# Patient Record
Sex: Male | Born: 1960 | Race: Black or African American | Hispanic: No | Smoking: Current every day smoker
Health system: Southern US, Community
[De-identification: ages and names within clinical notes are randomized; demographics above are authoritative.]

## PROBLEM LIST (undated history)

## (undated) DIAGNOSIS — B20 Human immunodeficiency virus [HIV] disease: Secondary | ICD-10-CM

## (undated) DIAGNOSIS — E785 Hyperlipidemia, unspecified: Secondary | ICD-10-CM

## (undated) DIAGNOSIS — J45909 Unspecified asthma, uncomplicated: Secondary | ICD-10-CM

## (undated) DIAGNOSIS — I509 Heart failure, unspecified: Secondary | ICD-10-CM

## (undated) DIAGNOSIS — Z21 Asymptomatic human immunodeficiency virus [HIV] infection status: Secondary | ICD-10-CM

---

## 2016-02-03 ENCOUNTER — Encounter: Payer: Self-pay | Admitting: Emergency Medicine

## 2016-02-03 ENCOUNTER — Emergency Department: Payer: Self-pay

## 2016-02-03 ENCOUNTER — Emergency Department
Admission: EM | Admit: 2016-02-03 | Discharge: 2016-02-04 | Disposition: A | Discharge status: Home / Self Care | Payer: Self-pay | Attending: Emergency Medicine | Admitting: Emergency Medicine

## 2016-02-03 DIAGNOSIS — K458 Other specified abdominal hernia without obstruction or gangrene: Secondary | ICD-10-CM | POA: Insufficient documentation

## 2016-02-03 DIAGNOSIS — F17200 Nicotine dependence, unspecified, uncomplicated: Secondary | ICD-10-CM | POA: Insufficient documentation

## 2016-02-03 DIAGNOSIS — R05 Cough: Secondary | ICD-10-CM | POA: Insufficient documentation

## 2016-02-03 DIAGNOSIS — J45901 Unspecified asthma with (acute) exacerbation: Secondary | ICD-10-CM | POA: Insufficient documentation

## 2016-02-03 DIAGNOSIS — Z760 Encounter for issue of repeat prescription: Secondary | ICD-10-CM | POA: Insufficient documentation

## 2016-02-03 DIAGNOSIS — K625 Hemorrhage of anus and rectum: Secondary | ICD-10-CM | POA: Insufficient documentation

## 2016-02-03 HISTORY — DX: Heart failure, unspecified: I50.9

## 2016-02-03 HISTORY — DX: Unspecified asthma, uncomplicated: J45.909

## 2016-02-03 HISTORY — DX: Asymptomatic human immunodeficiency virus (hiv) infection status: Z21

## 2016-02-03 HISTORY — DX: Hyperlipidemia, unspecified: E78.5

## 2016-02-03 HISTORY — DX: Human immunodeficiency virus (HIV) disease: B20

## 2016-02-03 LAB — URINALYSIS COMPLETE WITH MICROSCOPIC (ARMC ONLY)
BACTERIA UA: NONE SEEN
Bilirubin Urine: NEGATIVE
Glucose, UA: NEGATIVE mg/dL
HGB URINE DIPSTICK: NEGATIVE
LEUKOCYTES UA: NEGATIVE
NITRITE: NEGATIVE
PH: 5 (ref 5.0–8.0)
PROTEIN: NEGATIVE mg/dL
SPECIFIC GRAVITY, URINE: 1.025 (ref 1.005–1.030)
SQUAMOUS EPITHELIAL / LPF: NONE SEEN

## 2016-02-03 LAB — COMPREHENSIVE METABOLIC PANEL
ALBUMIN: 4.2 g/dL (ref 3.5–5.0)
ALT: 27 U/L (ref 17–63)
ANION GAP: 9 (ref 5–15)
AST: 36 U/L (ref 15–41)
Alkaline Phosphatase: 93 U/L (ref 38–126)
BILIRUBIN TOTAL: 0.5 mg/dL (ref 0.3–1.2)
BUN: 15 mg/dL (ref 6–20)
CHLORIDE: 109 mmol/L (ref 101–111)
CO2: 22 mmol/L (ref 22–32)
Calcium: 8.7 mg/dL — ABNORMAL LOW (ref 8.9–10.3)
Creatinine, Ser: 1.19 mg/dL (ref 0.61–1.24)
GFR calc Af Amer: >60 mL/min (ref >60)
Glucose, Bld: 168 mg/dL — ABNORMAL HIGH (ref 65–99)
POTASSIUM: 3.9 mmol/L (ref 3.5–5.1)
Sodium: 140 mmol/L (ref 135–145)
TOTAL PROTEIN: 7.8 g/dL (ref 6.5–8.1)

## 2016-02-03 LAB — CBC
HEMATOCRIT: 40.6 % (ref 40.0–52.0)
HEMOGLOBIN: 14.3 g/dL (ref 13.0–18.0)
MCH: 32.1 pg (ref 26.0–34.0)
MCHC: 35.3 g/dL (ref 32.0–36.0)
MCV: 91 fL (ref 80.0–100.0)
Platelets: 183 10*3/uL (ref 150–440)
RBC: 4.46 MIL/uL (ref 4.40–5.90)
RDW: 13.3 % (ref 11.5–14.5)
WBC: 4.1 10*3/uL (ref 3.8–10.6)

## 2016-02-03 LAB — LIPASE, BLOOD: LIPASE: 35 U/L (ref 11–51)

## 2016-02-03 MED ORDER — LORAZEPAM 2 MG/ML IJ SOLN
1.0000 mg | Freq: Once | INTRAMUSCULAR | Status: AC
  Administered 2016-02-03: 1 mg via INTRAVENOUS
  Filled 2016-02-03: qty 1

## 2016-02-03 MED ORDER — IPRATROPIUM-ALBUTEROL 0.5-2.5 (3) MG/3ML IN SOLN
3.0000 mL | Freq: Once | RESPIRATORY_TRACT | Status: AC
  Administered 2016-02-03: 3 mL via RESPIRATORY_TRACT
  Filled 2016-02-03: qty 3

## 2016-02-03 MED ORDER — IOHEXOL 300 MG/ML  SOLN
100.0000 mL | Freq: Once | INTRAMUSCULAR | Status: AC | PRN
  Administered 2016-02-03: 100 mL via INTRAVENOUS

## 2016-02-03 MED ORDER — IOHEXOL 240 MG/ML SOLN
25.0000 mL | Freq: Once | INTRAMUSCULAR | Status: AC | PRN
  Administered 2016-02-03: 25 mL via ORAL

## 2016-02-03 NOTE — ED Notes (Signed)
Pt in via triage w/ complaints of swelling, sob, abdominal pain and rectal bleeding for about 3 weeks.  Pt reports being HIV positive and being off meds for 3 weeks because he was unable to see primary MD due to no copay.  Pt appears to be short of breath, 97% on room air.  Pt with abdominal distension, pt reports "surgical hernia acting up."  MD at bedside.

## 2016-02-03 NOTE — ED Notes (Signed)
Patient transported to X-ray 

## 2016-02-03 NOTE — ED Notes (Signed)
Pt to ed with c/o abd pain, swelling, sob, and rectal bleeding.  Pt reports he is HIV positive and has been off his meds for 3 weeks. Pt tachypenic at triage rr 28.  Pt in mild resp distress at this time.

## 2016-02-04 MED ORDER — PRAVASTATIN SODIUM 40 MG PO TABS: 40.0000 mg | ORAL_TABLET | Freq: Every day | ORAL | Status: AC

## 2016-02-04 MED ORDER — ELVITEG-COBIC-EMTRICIT-TENOFAF 150-150-200-10 MG PO TABS: 1.0000 | ORAL_TABLET | Freq: Every day | ORAL | Status: AC

## 2016-02-04 MED ORDER — SULFAMETHOXAZOLE-TRIMETHOPRIM 800-160 MG PO TABS: 1.0000 | ORAL_TABLET | Freq: Once | ORAL | Status: AC

## 2016-02-04 NOTE — ED Provider Notes (Signed)
[facility] Emergency Department Provider Note  ____________________________________________  Time seen: 11:00 PM  I have reviewed the triage vital signs and the nursing notes.   HISTORY  Chief Complaint Rectal Bleeding and Shortness of Breath     HPI Gary Gates is a 55 y.o. male with history of HIV hyperlipidemia CHF presents with shortness of breath abdominal discomfort and swelling. Patient states that he has been out of his antiretrovirals 3 weeks secondary to relocating to this area. Patient states current abdominal discomfort he believes to be secondary to his "hernia acting up which was surgically repaired in the past. Patient states current pain score 5 out of 10. Patient denies any vomiting or diarrhea. Patient does however admit to bright red blood per rectum with wiping 3 weeks as well.     Past Medical History  Diagnosis Date  . HIV (human immunodeficiency virus infection) (HCC)   . Hyperlipidemia   . CHF (congestive heart failure) (HCC)   . Asthma     There are no active problems to display for this patient.   History reviewed. No pertinent past surgical history.  Current Outpatient Rx  Name  Route  Sig  Dispense  Refill  . elvitegravir-cobicistat-emtricitabine-tenofovir (GENVOYA) 150-150-200-10 MG TABS tablet   Oral   Take 1 tablet by mouth daily with breakfast.   30 tablet   0   . pravastatin (PRAVACHOL) 40 MG tablet   Oral   Take 1 tablet (40 mg total) by mouth daily.   30 tablet   0   . sulfamethoxazole-trimethoprim (BACTRIM DS,SEPTRA DS) 800-160 MG tablet   Oral   Take 1 tablet by mouth once.   30 tablet   0     Allergies No known drug allergies History reviewed. No pertinent family history.  Social History Social History  Substance Use Topics  . Smoking status: Current Every Day Smoker  . Smokeless tobacco: None  . Alcohol Use: Yes    Review of Systems  Constitutional: Negative for  fever. Eyes: Negative for visual changes. ENT: Negative for sore throat. Cardiovascular: Negative for chest pain. Respiratory: Negative for shortness of breath. Positive for cough and shortness of breath Gastrointestinal: Positive for abdominal pain Genitourinary: Negative for dysuria. Musculoskeletal: Negative for back pain. Skin: Negative for rash. Neurological: Negative for headaches, focal weakness or numbness.   10-point ROS otherwise negative.  ____________________________________________   PHYSICAL EXAM:  VITAL SIGNS: ED Triage Vitals  Enc Vitals Group     BP 02/03/16 1809 106/73 mmHg     Pulse Rate 02/03/16 1809 86     Resp 02/03/16 1809 28     Temp 02/03/16 1809 98.3 F (36.8 C)     Temp Source 02/03/16 1809 Oral     SpO2 02/03/16 1809 98 %     Weight 02/03/16 1809 205 lb (92.987 kg)     Height 02/03/16 1809  (1.778 m)     Head Cir --      Peak Flow --      Pain Score 02/03/16 1809 8     Pain Loc --      Pain Edu? --      Excl. in GC? --      Constitutional: Alert and oriented. Well appearing and in no distress. Eyes: Conjunctivae are normal. PERRL. Normal extraocular movements. ENT   Head: Normocephalic and atraumatic.   Nose: No congestion/rhinnorhea.   Mouth/Throat: Mucous membranes are moist.   Neck: No stridor. Hematological/Lymphatic/Immunilogical: No cervical  lymphadenopathy. Cardiovascular: Normal rate, regular rhythm. Normal and symmetric distal pulses are present in all extremities. No murmurs, rubs, or gallops. Respiratory: Normal respiratory effort without tachypnea nor retractions. Breath sounds are clear and equal bilaterally. No wheezes/rales/rhonchi. Gastrointestinal: Soft and nontender. No distention. There is no CVA tenderness. Genitourinary: deferred Musculoskeletal: Nontender with normal range of motion in all extremities. No joint effusions.  No lower extremity tenderness nor edema. Neurologic:  Normal speech and  language. No gross focal neurologic deficits are appreciated. Speech is normal.  Skin:  Skin is warm, dry and intact. No rash noted. Psychiatric: Mood and affect are normal. Speech and behavior are normal. Patient exhibits appropriate insight and judgment.  ____________________________________________    LABS (pertinent positives/negatives)  Labs Reviewed  COMPREHENSIVE METABOLIC PANEL - Abnormal; Notable for the following:    Glucose, Bld 168 (*)    Calcium 8.7 (*)    All other components within normal limits  URINALYSIS COMPLETEWITH MICROSCOPIC ([facility] ONLY) - Abnormal; Notable for the following:    Color, Urine YELLOW (*)    APPearance CLEAR (*)    Ketones, ur TRACE (*)    All other components within normal limits  LIPASE, BLOOD  CBC     ____________________________________________   EKG  ED ECG REPORT I, Shuntavia Yerby, Raton N, the attending physician, personally viewed and interpreted this ECG.   Date: 02/04/2016  EKG Time: 6:32 PM  Rate: 84  Rhythm: Normal sinus rhythm  Axis: Normal  Intervals: Normal  ST&T Change: None   ____________________________________________    RADIOLOGY  CT Abdomen Pelvis W Contrast (Final result) Result time: 02/04/16 00:18:28   Final result by Rad Results In Interface (02/04/16 00:18:28)   Narrative:   CLINICAL DATA: Initial evaluation for acute abdominal pain, abdominal distension, rectal bleeding for approximately 3 weeks.  EXAM: CT ABDOMEN AND PELVIS WITH CONTRAST  TECHNIQUE: Multidetector CT imaging of the abdomen and pelvis was performed using the standard protocol following bolus administration of intravenous contrast.  CONTRAST: OMNIPAQUE IOHEXOL 300 MG/ML SOLN  COMPARISON: None.  FINDINGS: Minimal subsegmental atelectasis seen dependently within the visualized lung bases. Visualized lungs are otherwise clear without acute process.  Liver demonstrates a normal contrast enhanced  appearance. Gallbladder is absent. No significant biliary dilatation. Spleen, adrenal glands, and pancreas demonstrate a normal contrast enhanced appearance.  3.2 cm parapelvic cyst present within the right kidney. Additional subcentimeter hypodensity at the upper pole the right kidney too small the characterize, but statistically likely reflects a small cyst as well. Kidneys are otherwise unremarkable without evidence of nephrolithiasis, hydronephrosis, or focal enhancing renal mass.  Stomach within normal limits. No evidence for bowel obstruction. No acute inflammatory changes about the small bowel. Appendix well visualized in the right lower quadrant/ lower mid abdomen and is of normal caliber and appearance without associated inflammatory changes to suggest acute appendicitis. No abnormal wall thickening, mucosal enhancement, or inflammatory fat stranding seen elsewhere about the bowels.  Bladder within normal limits. Prostate normal.  Small bilateral fat containing inguinal hernias noted. Diastasis of rectus abdominis musculature without frank hernia. Probable small focus of overlying fat necrosis within the subcutaneous fat noted (series 2, image 47).  No free air or fluid. No pathologically enlarged intra-abdominal or pelvic lymph nodes. Few shotty subcentimeter gastrohepatic nodes noted. Mild to moderate aorto bi-iliac atherosclerotic calcifications. No aneurysm.  No acute osseous abnormality. No worrisome lytic or blastic osseous lesions.  IMPRESSION: 1. No CT evidence for acute intra-abdominal or pelvic process. 2. Diastasis of the rectus  abdominis musculature without frank hernia. 3. Normal appendix. 4. Status post cholecystectomy.   Electronically Signed By: Rise Mu M.D. On: 02/04/2016 00:18          DG Chest 2 View (Final result) Result time: 02/03/16 22:36:44   Final result by Rad Results In Interface (02/03/16 22:36:44)    Narrative:   CLINICAL DATA: 55 year old male with abdominal pain  EXAM: CHEST 2 VIEW  COMPARISON: None.  FINDINGS: The heart size and mediastinal contours are within normal limits. Both lungs are clear. The visualized skeletal structures are unremarkable.  IMPRESSION: No active cardiopulmonary disease.   Electronically Signed By: Elgie Collard M.D. On: 02/03/2016 22:36         INITIAL IMPRESSION / ASSESSMENT AND PLAN / ED COURSE  Pertinent labs & imaging results that were available during my care of the patient were reviewed by me and considered in my medical decision making (see chart for details).    ____________________________________________   FINAL CLINICAL IMPRESSION(S) / ED DIAGNOSES  Final diagnoses:  Medication refill  Other specified abdominal hernia without obstruction or gangrene      Darci Current, MD 02/04/16 [phone]

## 2016-02-04 NOTE — Discharge Instructions (Signed)
Hernia, Adult A hernia is the bulging of an organ or tissue through a weak spot in the muscles of the abdomen (abdominal wall). Hernias develop most often near the navel or groin. There are many kinds of hernias. Common kinds include:  Femoral hernia. This kind of hernia develops under the groin in the upper thigh area.  Inguinal hernia. This kind of hernia develops in the groin or scrotum.  Umbilical hernia. This kind of hernia develops near the navel.  Hiatal hernia. This kind of hernia causes part of the stomach to be pushed up into the chest.  Incisional hernia. This kind of hernia bulges through a scar from an abdominal surgery. CAUSES This condition may be caused by:  Heavy lifting.  Coughing over a long period of time.  Straining to have a bowel movement.  An incision made during an abdominal surgery.  A birth defect (congenital defect).  Excess weight or obesity.  Smoking.  Poor nutrition.  Cystic fibrosis.  Excess fluid in the abdomen.  Undescended testicles. SYMPTOMS Symptoms of a hernia include:  A lump on the abdomen. This is the first sign of a hernia. The lump may become more obvious with standing, straining, or coughing. It may get bigger over time if it is not treated or if the condition causing it is not treated.  Pain. A hernia is usually painless, but it may become painful over time if treatment is delayed. The pain is usually dull and may get worse with standing or lifting heavy objects. Sometimes a hernia gets tightly squeezed in the weak spot (strangulated) or stuck there (incarcerated) and causes additional symptoms. These symptoms may include:  Vomiting.  Nausea.  Constipation.  Irritability. DIAGNOSIS A hernia may be diagnosed with:  A physical exam. During the exam your health care provider may ask you to cough or to make a specific movement, because a hernia is usually more visible when you move.  Imaging tests. These can  include:  X-rays.  Ultrasound.  CT scan. TREATMENT A hernia that is small and painless may not need to be treated. A hernia that is large or painful may be treated with surgery. Inguinal hernias may be treated with surgery to prevent incarceration or strangulation. Strangulated hernias are always treated with surgery, because lack of blood to the trapped organ or tissue can cause it to die. Surgery to treat a hernia involves pushing the bulge back into place and repairing the weak part of the abdomen. HOME CARE INSTRUCTIONS  Avoid straining.  Do not lift anything heavier than 10 lb (4.5 kg).  Lift with your leg muscles, not your back muscles. This helps avoid strain.  When coughing, try to cough gently.  Prevent constipation. Constipation leads to straining with bowel movements, which can make a hernia worse or cause a hernia repair to break down. You can prevent constipation by:  Eating a high-fiber diet that includes plenty of fruits and vegetables.  Drinking enough fluids to keep your urine clear or pale yellow. Aim to drink 6-8 glasses of water per day.  Using a stool softener as directed by your health care provider.  Lose weight, if you are overweight.  Do not use any tobacco products, including cigarettes, chewing tobacco, or electronic cigarettes. If you need help quitting, ask your health care provider.  Keep all follow-up visits as directed by your health care provider. This is important. Your health care provider may need to monitor your condition. SEEK MEDICAL CARE IF:  You have  swelling, redness, and pain in the affected area.  Your bowel habits change. SEEK IMMEDIATE MEDICAL CARE IF:  You have a fever.  You have abdominal pain that is getting worse.  You feel nauseous or you vomit.  You cannot push the hernia back in place by gently pressing on it while you are lying down.  The hernia:  Changes in shape or size.  Is stuck outside the  abdomen.  Becomes discolored.  Feels hard or tender.   This information is not intended to replace advice given to you by your health care provider. Make sure you discuss any questions you have with your health care provider.   Document Released: 12/10/2005 Document Revised: 12/31/2014 Document Reviewed: 10/20/2014 Elsevier Interactive Patient Education 2016 [employer].  Medicine Refill at the Emergency Department We have refilled your medicine today, but it is best for you to get refills through your primary health care provider's office. In the future, please plan ahead so you do not need to get refills from the emergency department. If the medicine we refilled was a maintenance medicine, you may have received only enough to get you by until you are able to see your regular health care provider.   This information is not intended to replace advice given to you by your health care provider. Make sure you discuss any questions you have with your health care provider.   Document Released: 03/28/2004 Document Revised: 12/31/2014 Document Reviewed: 03/19/2014 Elsevier Interactive Patient Education [employer]

## 2017-02-10 IMAGING — CR DG CHEST 2V
1 series · 2 of 2 positions shown · non-contrast
Comparison: None.

CLINICAL DATA: 54-year-old male with abdominal pain

EXAM:
CHEST  2 VIEW

[Series 1: dg chest 2 view · 0.14mm/px · 2 of 2 slices shown]
[im 1/2]
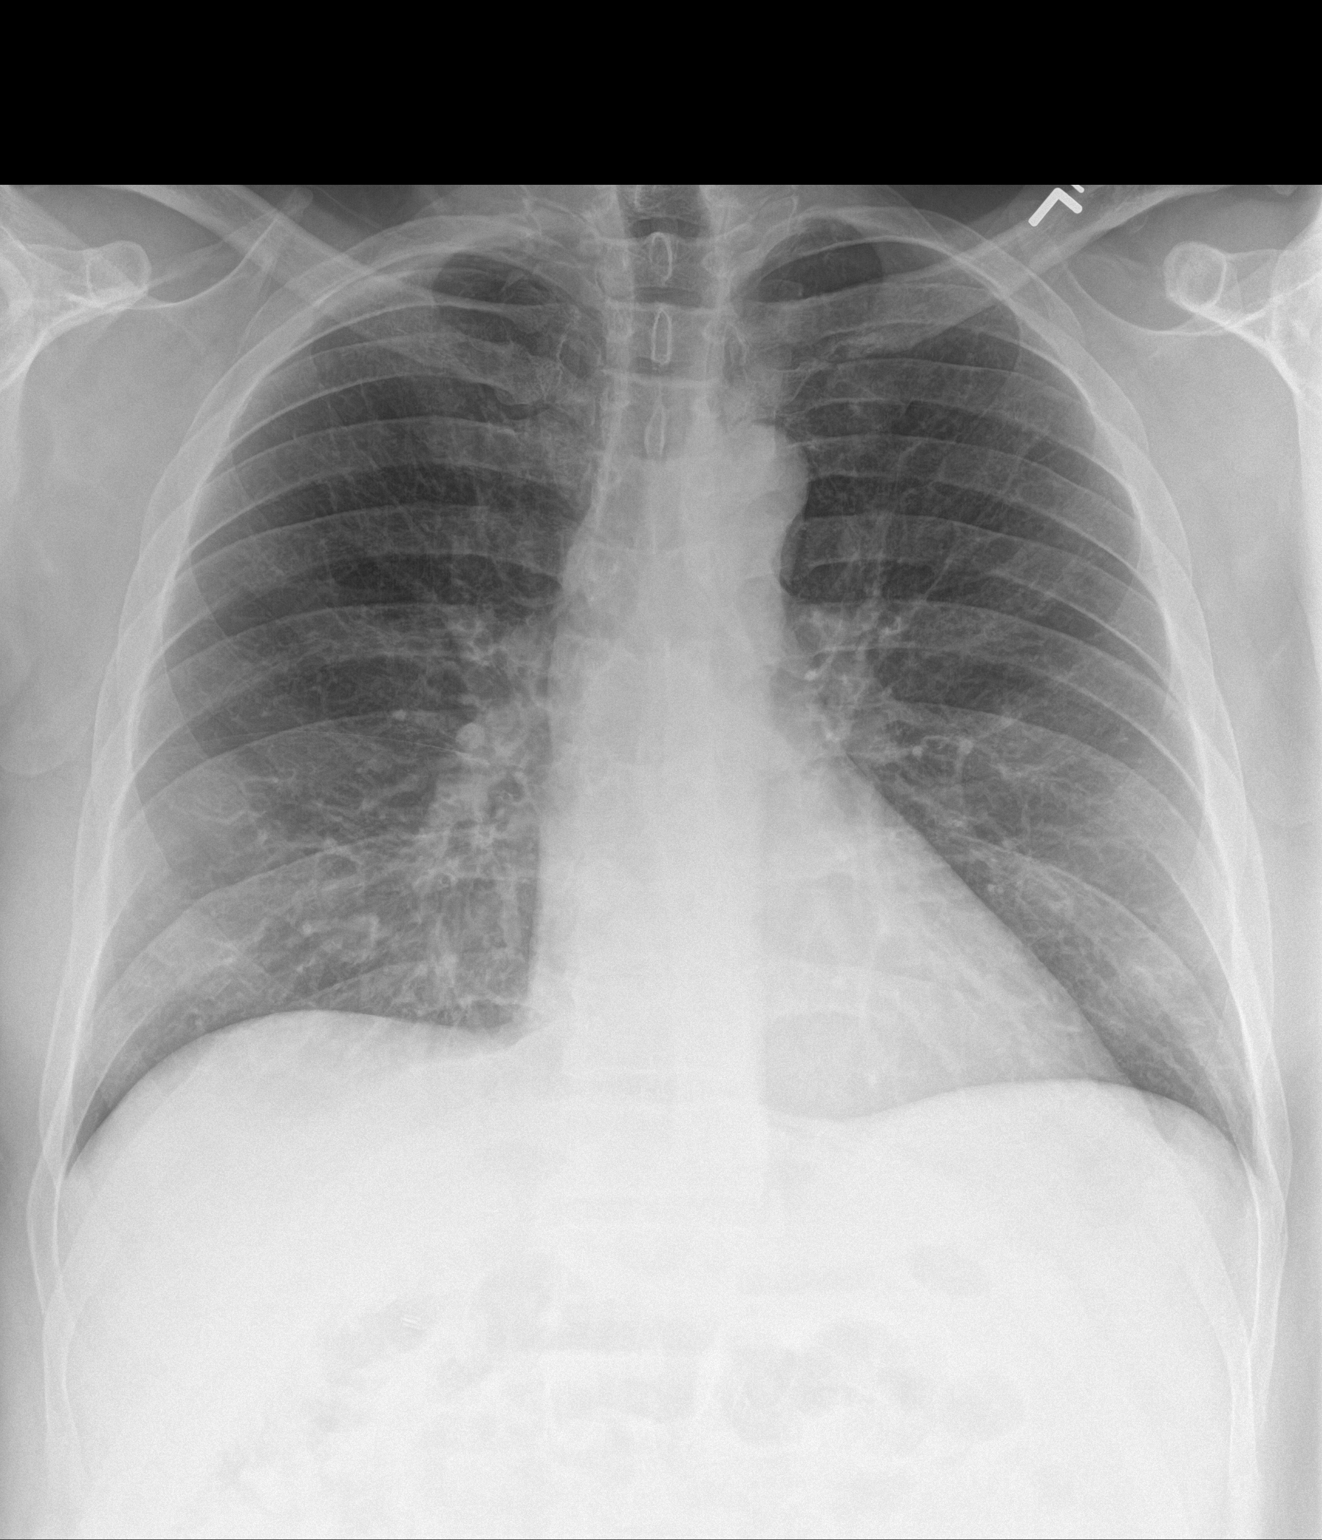
[im 2/2]
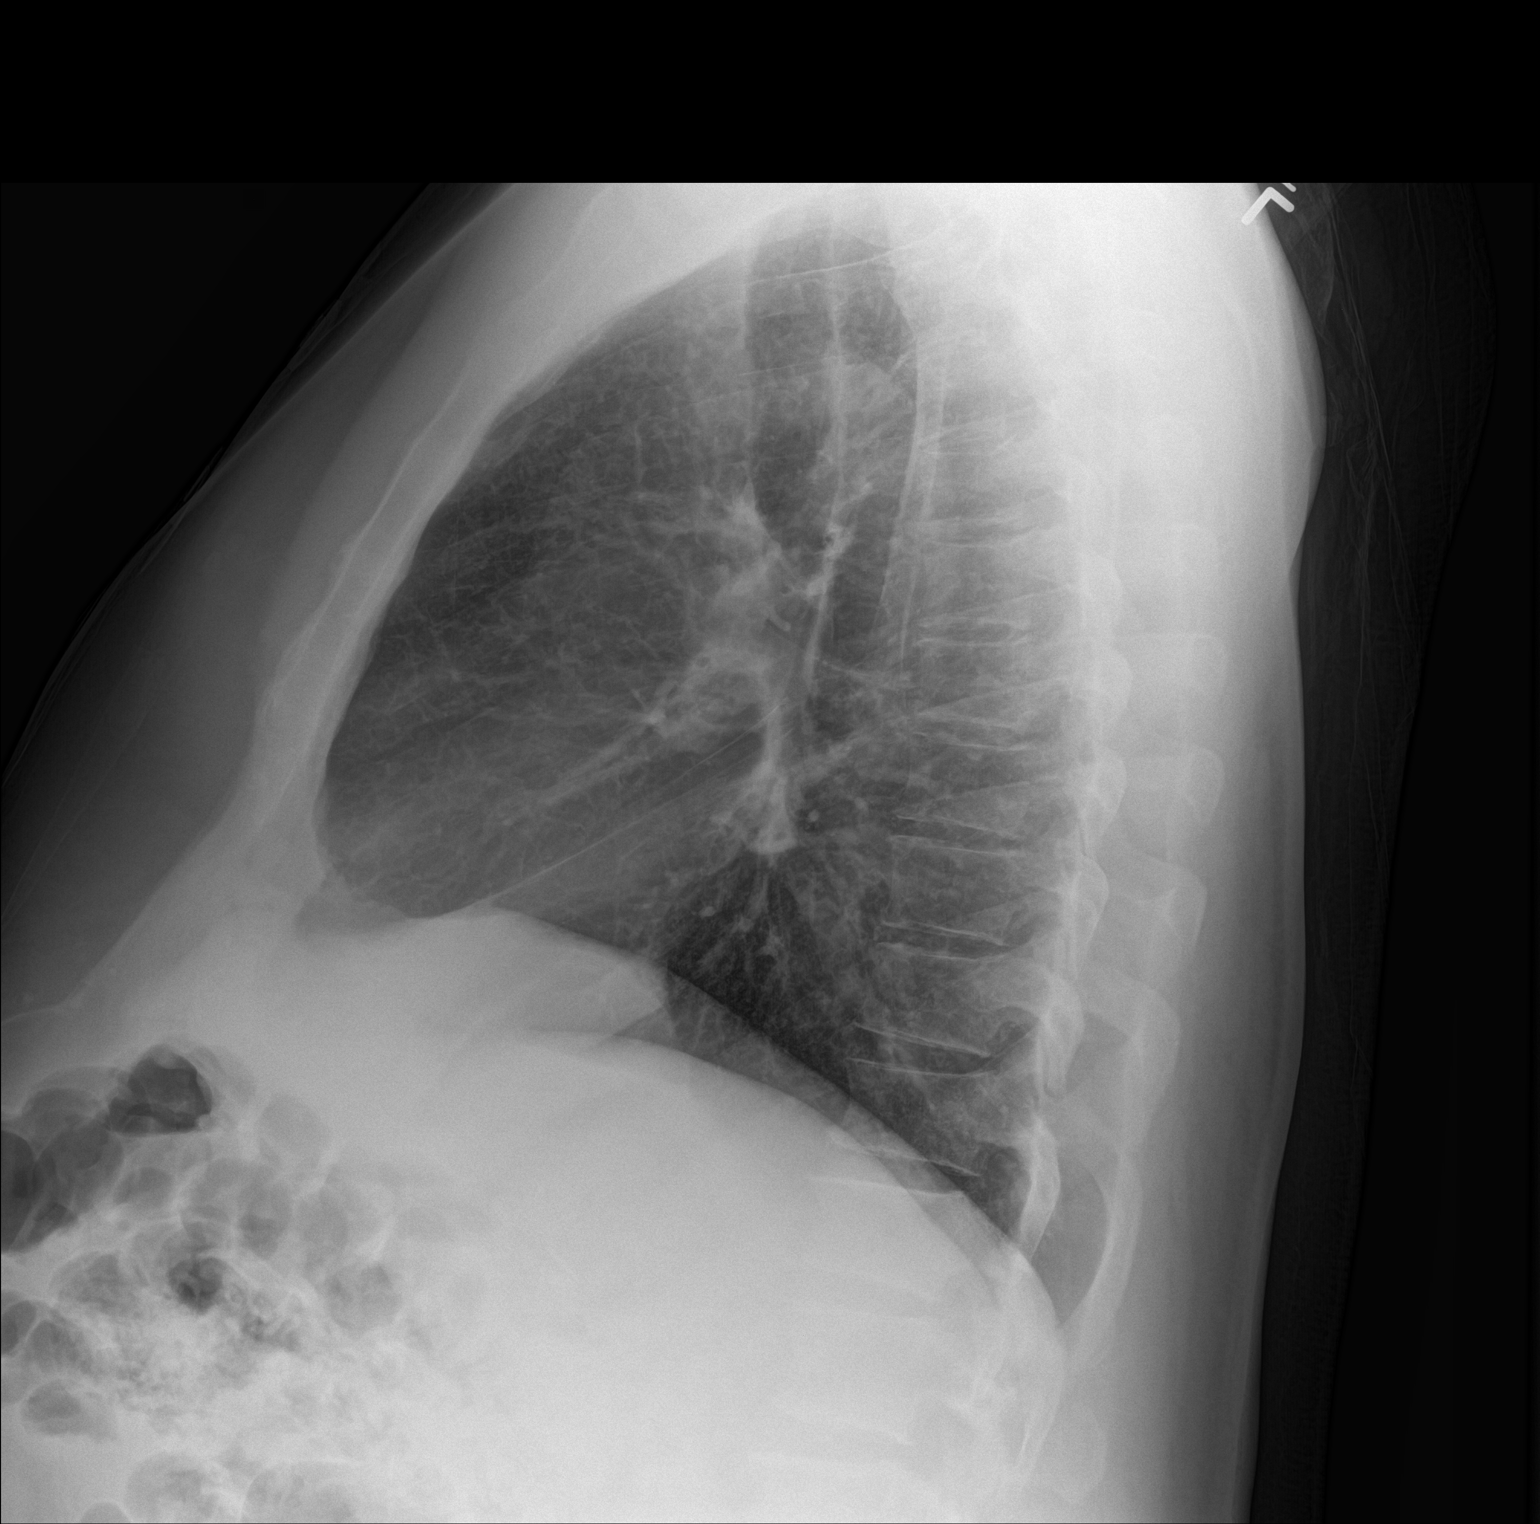

[2 of 2 positions shown; findings below may reference images not displayed]

FINDINGS: The heart size and mediastinal contours are within normal limits.
Both lungs are clear. The visualized skeletal structures are
unremarkable.
IMPRESSION: No active cardiopulmonary disease.

## 2017-02-10 IMAGING — CT CT ABD-PELV W/ CM
1 of 3 series · 13 of 32 positions shown, 18 images · IV contrast (omnipaque)
Comparison: None.

CLINICAL DATA: Initial evaluation for acute abdominal pain,
abdominal distension, rectal bleeding for approximately 3 weeks.

EXAM:
CT ABDOMEN AND PELVIS WITH CONTRAST
TECHNIQUE: Multidetector CT imaging of the abdomen and pelvis was performed
using the standard protocol following bolus administration of
intravenous contrast.
CONTRAST:  100mL OMNIPAQUE IOHEXOL 300 MG/ML  SOLN

[Series 2: routine abd pel with · axial · 0.82mm/px · z∈[-481,-31]mm · 13 of 102 slices shown, 18 images]
[im 6/102  soft-tissue]
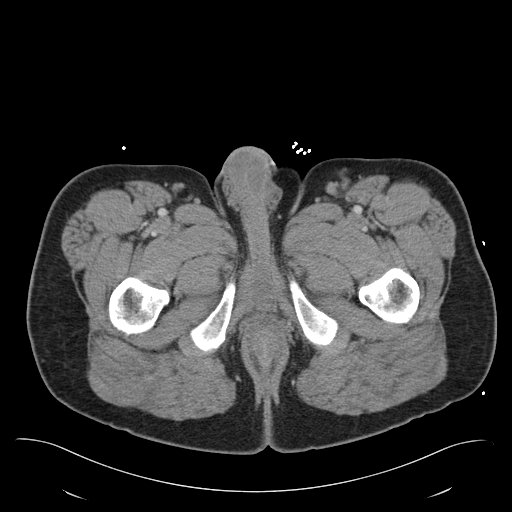
[im 6/102  bone]
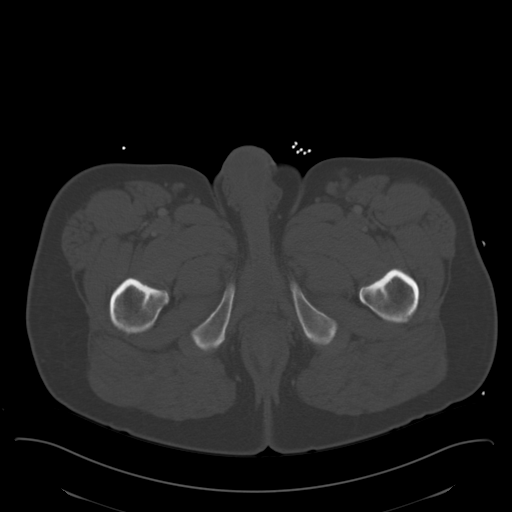
[im 16/102  soft-tissue]
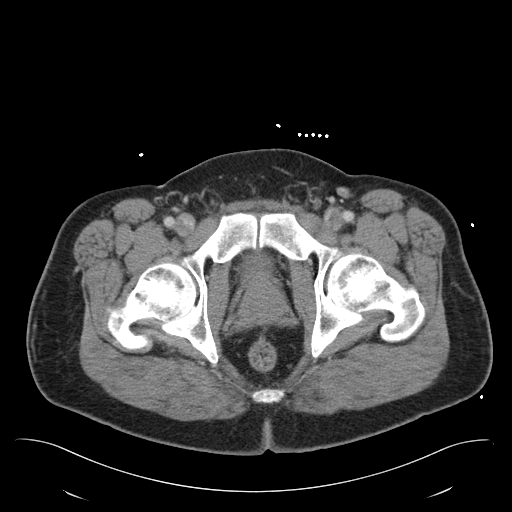
[im 21/102  soft-tissue]
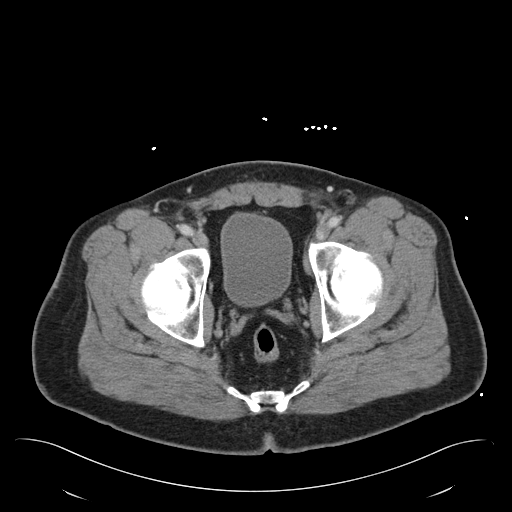
[im 31/102  soft-tissue]
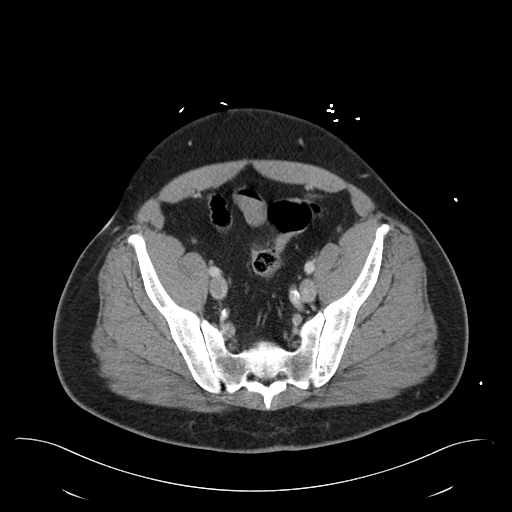
[im 41/102  soft-tissue]
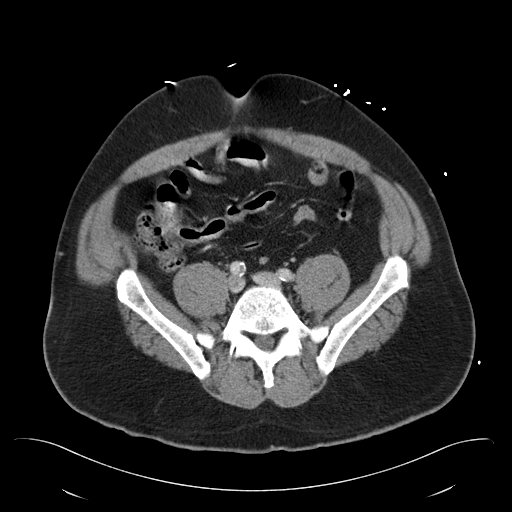
[im 46/102  soft-tissue]
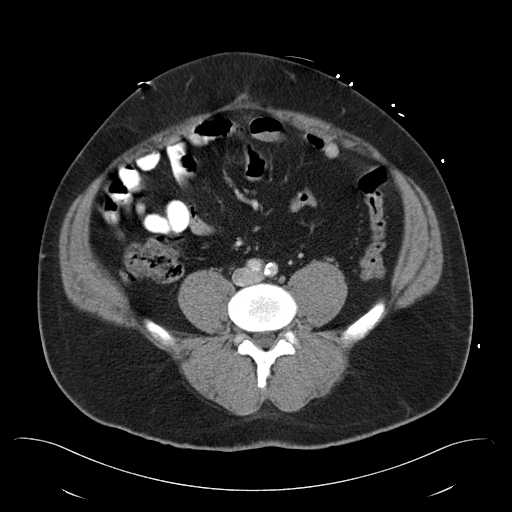
[im 56/102  soft-tissue]
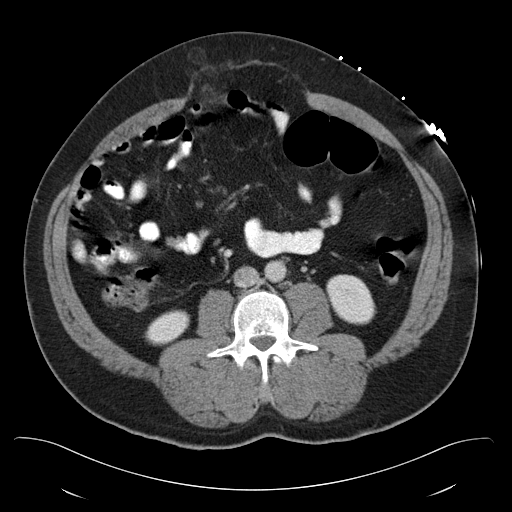
[im 61/102  soft-tissue]
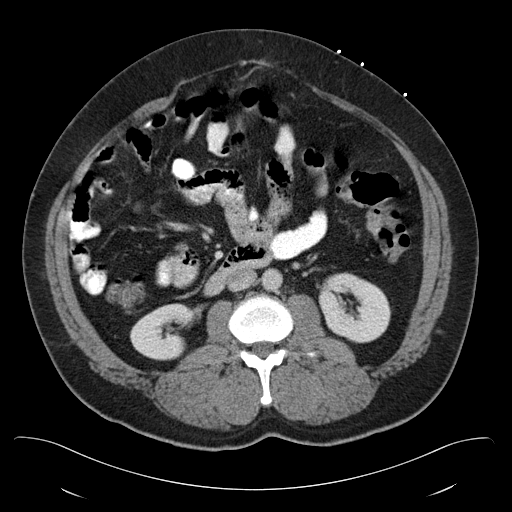
[im 71/102  soft-tissue]
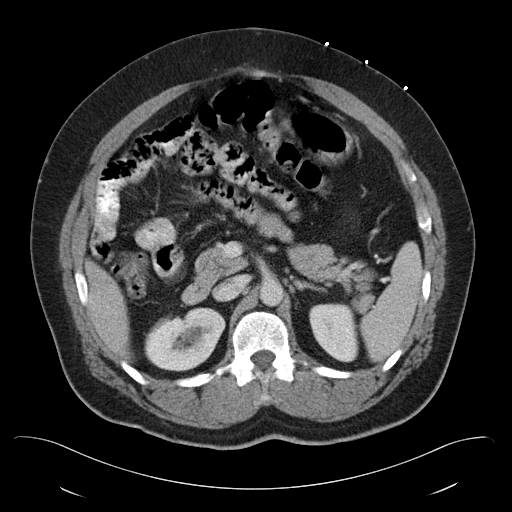
[im 71/102  bone]
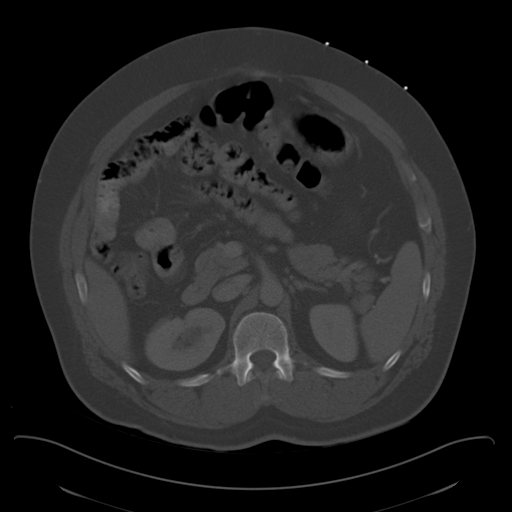
[im 81/102  soft-tissue]
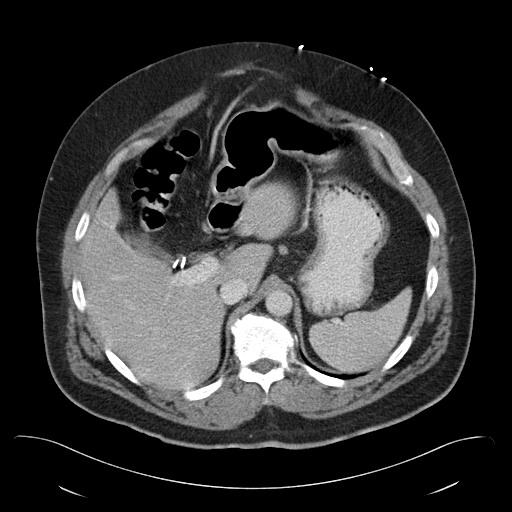
[im 81/102  lung]
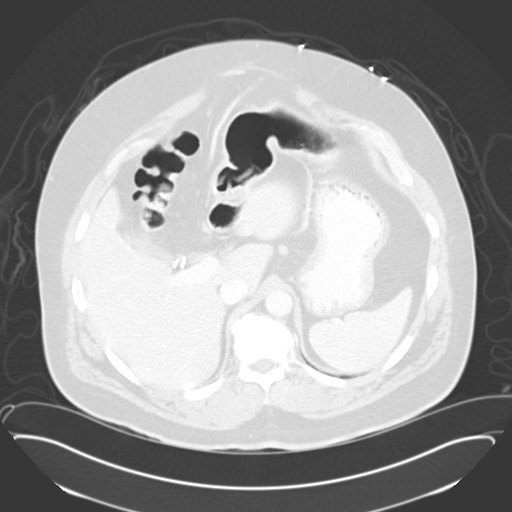
[im 86/102  soft-tissue]
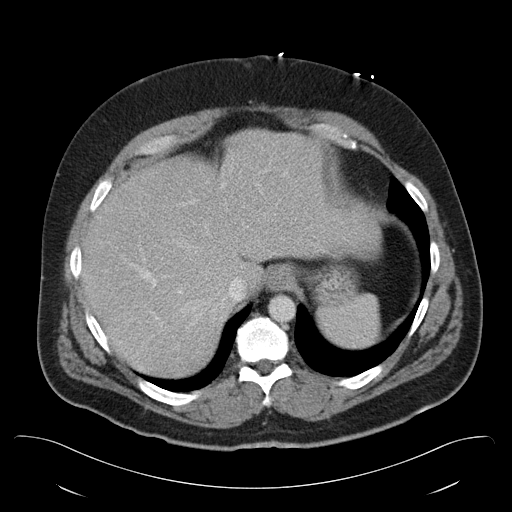
[im 86/102  lung]
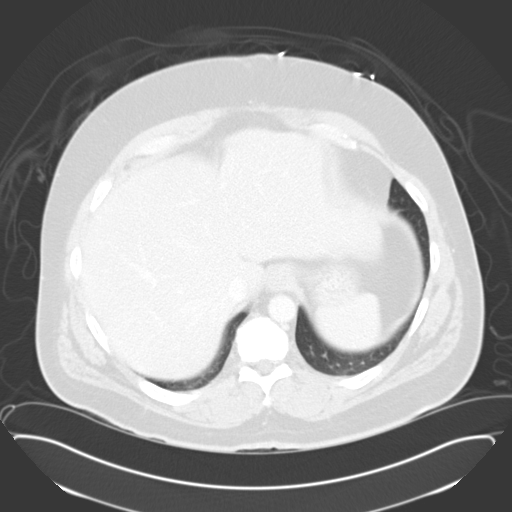
[im 91/102  lung]
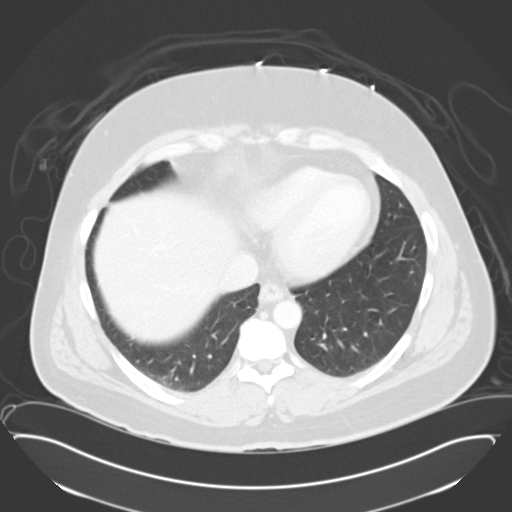
[im 96/102  soft-tissue]
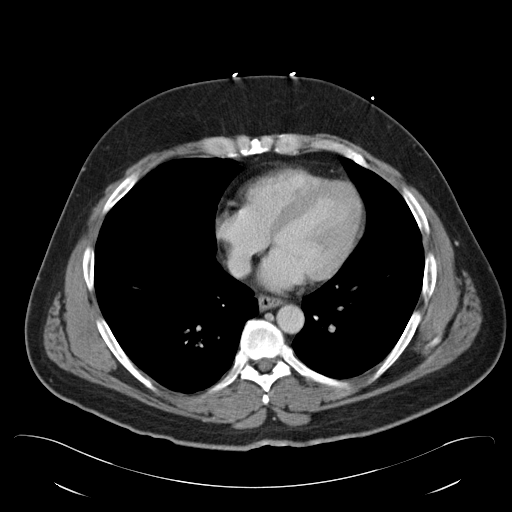
[im 96/102  lung]
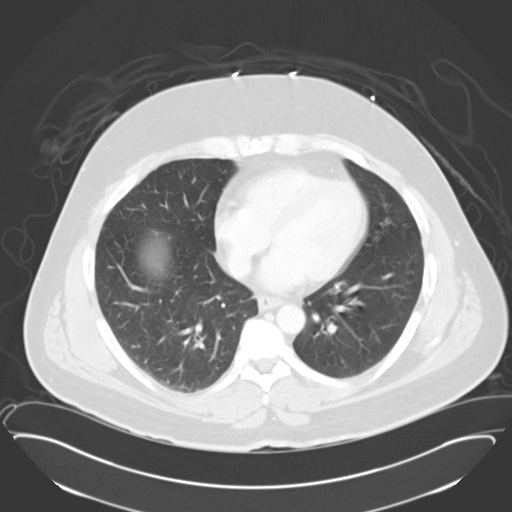

[13 of 32 positions shown; findings below may reference images not displayed]

FINDINGS: Minimal subsegmental atelectasis seen dependently within the
visualized lung bases. Visualized lungs are otherwise clear without
acute process.

Liver demonstrates a normal contrast enhanced appearance.
Gallbladder is absent. No significant biliary dilatation. Spleen,
adrenal glands, and pancreas demonstrate a normal contrast enhanced
appearance.

3.2 cm parapelvic cyst present within the right kidney. Additional
subcentimeter hypodensity at the upper pole the right kidney too
small the characterize, but statistically likely reflects a small
cyst as well. Kidneys are otherwise unremarkable without evidence of
nephrolithiasis, hydronephrosis, or focal enhancing renal mass.

Stomach within normal limits. No evidence for bowel obstruction. No
acute inflammatory changes about the small bowel. Appendix well
visualized in the right lower quadrant/ lower mid abdomen and is of
normal caliber and appearance without associated inflammatory
changes to suggest acute appendicitis. No abnormal wall thickening,
mucosal enhancement, or inflammatory fat stranding seen elsewhere
about the bowels.

Bladder within normal limits.  Prostate normal.

Small bilateral fat containing inguinal hernias noted. Diastasis of
rectus abdominis musculature without frank hernia. Probable small
focus of overlying fat necrosis within the subcutaneous fat noted
(series 2, image 47).

No free air or fluid. No pathologically enlarged intra-abdominal or
pelvic lymph nodes. Few shotty subcentimeter gastrohepatic nodes
noted. Mild to moderate aorto bi-iliac atherosclerotic
calcifications. No aneurysm.

No acute osseous abnormality. No worrisome lytic or blastic osseous
lesions.
IMPRESSION: 1. No CT evidence for acute intra-abdominal or pelvic process.
2. Diastasis of the rectus abdominis musculature without frank
hernia.
3. Normal appendix.
4. Status post cholecystectomy.
